# Patient Record
Sex: Female | Born: 1950 | Race: White | Hispanic: No | Smoking: Never smoker
Health system: Southern US, Community
[De-identification: ages and names within clinical notes are randomized; demographics above are authoritative.]

## PROBLEM LIST (undated history)

## (undated) DIAGNOSIS — R42 Dizziness and giddiness: Secondary | ICD-10-CM

## (undated) DIAGNOSIS — M25562 Pain in left knee: Secondary | ICD-10-CM

## (undated) DIAGNOSIS — D229 Melanocytic nevi, unspecified: Secondary | ICD-10-CM

## (undated) HISTORY — PX: WRIST FRACTURE SURGERY: SHX121

## (undated) HISTORY — PX: ANKLE FRACTURE SURGERY: SHX122

## (undated) HISTORY — PX: ABDOMINAL HYSTERECTOMY: SHX81

---
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
## 2000-10-13 ENCOUNTER — Encounter: Payer: Self-pay | Admitting: Family Medicine

## 2000-10-13 ENCOUNTER — Ambulatory Visit (HOSPITAL_COMMUNITY): Admission: RE | Admit: 2000-10-13 | Discharge: 2000-10-13 | Payer: Self-pay | Admitting: Family Medicine

## 2008-02-09 ENCOUNTER — Emergency Department: Payer: Self-pay | Admitting: Emergency Medicine

## 2008-02-09 ENCOUNTER — Ambulatory Visit: Payer: Self-pay | Admitting: General Practice

## 2008-04-26 ENCOUNTER — Encounter: Payer: Self-pay | Admitting: Unknown Physician Specialty

## 2008-04-28 ENCOUNTER — Encounter: Payer: Self-pay | Admitting: Unknown Physician Specialty

## 2008-05-29 ENCOUNTER — Encounter: Payer: Self-pay | Admitting: Unknown Physician Specialty

## 2009-10-07 ENCOUNTER — Emergency Department: Payer: Self-pay | Admitting: Emergency Medicine

## 2012-02-02 DIAGNOSIS — D229 Melanocytic nevi, unspecified: Secondary | ICD-10-CM

## 2012-02-02 HISTORY — DX: Melanocytic nevi, unspecified: D22.9

## 2015-03-15 ENCOUNTER — Other Ambulatory Visit: Payer: Self-pay | Admitting: Neurology

## 2015-03-15 DIAGNOSIS — R42 Dizziness and giddiness: Secondary | ICD-10-CM

## 2015-04-05 ENCOUNTER — Ambulatory Visit
Admission: RE | Admit: 2015-04-05 | Discharge: 2015-04-05 | Disposition: A | Discharge status: Home / Self Care | Payer: BLUE CROSS/BLUE SHIELD | Source: Ambulatory Visit | Attending: Neurology | Admitting: Neurology

## 2015-04-05 ENCOUNTER — Other Ambulatory Visit: Payer: Self-pay | Admitting: Neurology

## 2015-04-05 DIAGNOSIS — R42 Dizziness and giddiness: Secondary | ICD-10-CM | POA: Diagnosis present

## 2015-04-05 DIAGNOSIS — T1590XS Foreign body on external eye, part unspecified, unspecified eye, sequela: Secondary | ICD-10-CM

## 2015-04-05 DIAGNOSIS — Z9889 Other specified postprocedural states: Secondary | ICD-10-CM | POA: Diagnosis not present

## 2015-04-05 MED ORDER — GADOBENATE DIMEGLUMINE 529 MG/ML IV SOLN
20.0000 mL | Freq: Once | INTRAVENOUS | Status: AC | PRN
  Administered 2015-04-05: 20 mL via INTRAVENOUS

## 2015-07-14 ENCOUNTER — Emergency Department (HOSPITAL_COMMUNITY)
Admission: EM | Admit: 2015-07-14 | Discharge: 2015-07-14 | Disposition: A | Discharge status: Home / Self Care | Payer: BLUE CROSS/BLUE SHIELD | Attending: Emergency Medicine | Admitting: Emergency Medicine

## 2015-07-14 ENCOUNTER — Emergency Department (HOSPITAL_COMMUNITY): Payer: BLUE CROSS/BLUE SHIELD

## 2015-07-14 ENCOUNTER — Encounter (HOSPITAL_COMMUNITY): Payer: Self-pay | Admitting: Emergency Medicine

## 2015-07-14 DIAGNOSIS — K8020 Calculus of gallbladder without cholecystitis without obstruction: Secondary | ICD-10-CM

## 2015-07-14 DIAGNOSIS — R1013 Epigastric pain: Secondary | ICD-10-CM | POA: Diagnosis present

## 2015-07-14 DIAGNOSIS — Z7982 Long term (current) use of aspirin: Secondary | ICD-10-CM | POA: Insufficient documentation

## 2015-07-14 HISTORY — DX: Dizziness and giddiness: R42

## 2015-07-14 HISTORY — DX: Pain in left knee: M25.562

## 2015-07-14 LAB — CBC WITH DIFFERENTIAL/PLATELET
Basophils Absolute: 0 K/uL (ref 0.0–0.1)
Basophils Relative: 0 %
Eosinophils Absolute: 0.1 K/uL (ref 0.0–0.7)
Eosinophils Relative: 1 %
HCT: 38.5 % (ref 36.0–46.0)
Hemoglobin: 12.6 g/dL (ref 12.0–15.0)
Lymphocytes Relative: 20 %
Lymphs Abs: 2 K/uL (ref 0.7–4.0)
MCH: 31.2 pg (ref 26.0–34.0)
MCHC: 32.7 g/dL (ref 30.0–36.0)
MCV: 95.3 fL (ref 78.0–100.0)
Monocytes Absolute: 0.4 K/uL (ref 0.1–1.0)
Monocytes Relative: 5 %
Neutro Abs: 7.2 K/uL (ref 1.7–7.7)
Neutrophils Relative %: 74 %
Platelets: 235 K/uL (ref 150–400)
RBC: 4.04 MIL/uL (ref 3.87–5.11)
RDW: 13.7 % (ref 11.5–15.5)
WBC: 9.8 K/uL (ref 4.0–10.5)

## 2015-07-14 LAB — LIPASE, BLOOD: Lipase: 29 U/L (ref 11–51)

## 2015-07-14 LAB — COMPREHENSIVE METABOLIC PANEL WITH GFR
ALT: 28 U/L (ref 14–54)
AST: 50 U/L — ABNORMAL HIGH (ref 15–41)
Albumin: 4.2 g/dL (ref 3.5–5.0)
Alkaline Phosphatase: 99 U/L (ref 38–126)
Anion gap: 6 (ref 5–15)
BUN: 26 mg/dL — ABNORMAL HIGH (ref 6–20)
CO2: 30 mmol/L (ref 22–32)
Calcium: 9.1 mg/dL (ref 8.9–10.3)
Chloride: 108 mmol/L (ref 101–111)
Creatinine, Ser: 0.77 mg/dL (ref 0.44–1.00)
GFR calc Af Amer: >60 mL/min
GFR calc non Af Amer: >60 mL/min
Glucose, Bld: 120 mg/dL — ABNORMAL HIGH (ref 65–99)
Potassium: 3.7 mmol/L (ref 3.5–5.1)
Sodium: 144 mmol/L (ref 135–145)
Total Bilirubin: 0.6 mg/dL (ref 0.3–1.2)
Total Protein: 7.1 g/dL (ref 6.5–8.1)

## 2015-07-14 LAB — URINALYSIS, ROUTINE W REFLEX MICROSCOPIC
BILIRUBIN URINE: NEGATIVE
Glucose, UA: NEGATIVE mg/dL
HGB URINE DIPSTICK: NEGATIVE
Ketones, ur: NEGATIVE mg/dL
LEUKOCYTES UA: NEGATIVE
NITRITE: NEGATIVE
PROTEIN: NEGATIVE mg/dL
SPECIFIC GRAVITY, URINE: 1.025 (ref 1.005–1.030)
pH: 5.5 (ref 5.0–8.0)

## 2015-07-14 LAB — TROPONIN I

## 2015-07-14 MED ORDER — FAMOTIDINE IN NACL 20-0.9 MG/50ML-% IV SOLN
20.0000 mg | Freq: Once | INTRAVENOUS | Status: AC
  Administered 2015-07-14: 20 mg via INTRAVENOUS
  Filled 2015-07-14: qty 50

## 2015-07-14 MED ORDER — IOHEXOL 300 MG/ML  SOLN
50.0000 mL | Freq: Once | INTRAMUSCULAR | Status: AC | PRN
  Administered 2015-07-14: 50 mL via ORAL

## 2015-07-14 MED ORDER — SODIUM CHLORIDE 0.9 % IV SOLN
INTRAVENOUS | Status: DC
  Administered 2015-07-14: 16:00:00 via INTRAVENOUS

## 2015-07-14 MED ORDER — IOHEXOL 300 MG/ML  SOLN
100.0000 mL | Freq: Once | INTRAMUSCULAR | Status: AC | PRN
  Administered 2015-07-14: 100 mL via INTRAVENOUS

## 2015-07-14 NOTE — ED Notes (Signed)
Patient with no complaints at this time. Respirations even and unlabored. Skin warm/dry. Discharge instructions reviewed with patient at this time. Patient given opportunity to voice concerns/ask questions. IV removed per policy and band-aid applied to site. Patient discharged at this time and left Emergency Department with steady gait.  

## 2015-07-14 NOTE — ED Provider Notes (Signed)
CSN: [id number]     Arrival date & time 07/14/15  1408 History   First MD Initiated Contact with Patient 07/14/15 1440     Chief Complaint  Patient presents with  . Abdominal Pain      HPI Pt was seen at 1455.  Per pt, c/o gradual onset and persistence of constant upper abd "pain" that began approximately 1 hour PTA. Pain is located in her mid-epigastric area, and started shortly after she ate a tuna sandwich for lunch.  Has been associated with generalized weakness.  Describes the abd pain as "aching," with radiation into her back. Denies N/V, no diarrhea, no fevers, no back pain, no rash, no CP/SOB, no black or blood in stools.       Past Medical History  Diagnosis Date  . Dizziness   . Left knee pain    Past Surgical History  Procedure Laterality Date  . Abdominal hysterectomy    . Ankle fracture surgery    . Wrist fracture surgery      Social History  Substance Use Topics  . Smoking status: Never Smoker   . Smokeless tobacco: None  . Alcohol Use: No    Review of Systems ROS: Statement: All systems negative except as marked or noted in the HPI; Constitutional: Negative for fever and chills. +generalized weakness.; ; Eyes: Negative for eye pain, redness and discharge. ; ; ENMT: Negative for ear pain, hoarseness, nasal congestion, sinus pressure and sore throat. ; ; Cardiovascular: Negative for chest pain, palpitations, dyspnea and peripheral edema. ; ; Respiratory: Negative for cough, wheezing and stridor. ; ; Gastrointestinal: +abd pain. Negative for nausea, vomiting, diarrhea, blood in stool, hematemesis, jaundice and rectal bleeding. . ; ; Genitourinary: Negative for dysuria, flank pain and hematuria. ; ; Musculoskeletal: Negative for back pain and neck pain. Negative for swelling and trauma.; ; Skin: Negative for pruritus, rash, abrasions, blisters, bruising and skin lesion.; ; Neuro: Negative for headache, lightheadedness and neck stiffness. Negative for weakness, altered  level of consciousness , altered mental status, extremity weakness, paresthesias, involuntary movement, seizure and syncope.      Allergies  Review of patient's allergies indicates no known allergies.  Home Medications   Prior to Admission medications   Medication Sig Start Date End Date Taking? Authorizing Provider  aspirin EC 81 MG tablet Take 81 mg by mouth daily.    Yes Historical Provider, MD  Cholecalciferol (VITAMIN D3 SUPER STRENGTH) 2000 units TABS Take 1 tablet by mouth daily.    Yes Historical Provider, MD  Ginger, Zingiber officinalis, (GINGER PO) Take 1 tablet by mouth daily.    Yes Historical Provider, MD  meclizine (ANTIVERT) 12.5 MG tablet Take 12.5 mg by mouth daily as needed for dizziness.    Yes Historical Provider, MD  Multiple Vitamins-Minerals (MULTIVITAMIN WITH MINERALS) tablet Take 1 tablet by mouth daily.   Yes Historical Provider, MD  TURMERIC PO Take 1 tablet by mouth daily.   Yes Historical Provider, MD   BP 158/74 mmHg  Pulse 89  Temp(Src) 97.8 F (36.6 C) (Oral)  Resp 18  Ht 5' 6.5" (1.689 m)  Wt 248 lb (112.492 kg)  BMI 39.43 kg/m2  SpO2 99% Physical Exam  1500: Physical examination:  Nursing notes reviewed; Vital signs and O2 SAT reviewed;  Constitutional: Well developed, Well nourished, Well hydrated, In no acute distress; Head:  Normocephalic, atraumatic; Eyes: EOMI, PERRL, No scleral icterus; ENMT: Mouth and pharynx normal, Mucous membranes moist; Neck: Supple, Full range of motion, No  lymphadenopathy; Cardiovascular: Regular rate and rhythm, No murmur, rub, or gallop; Respiratory: Breath sounds clear & equal bilaterally, No rales, rhonchi, wheezes.  Speaking full sentences with ease, Normal respiratory effort/excursion; Chest: Nontender, Movement normal; Abdomen: Soft, +mild mid-epigastric area tender to palp. No rebound or guarding. Nondistended, Normal bowel sounds; Genitourinary: No CVA tenderness; Spine:  No midline CS, TS, LS tenderness.;;  Extremities: Pulses normal, No tenderness, No edema, No calf edema or asymmetry.; Neuro: AA&Ox3, Major CN grossly intact.  Speech clear. No gross focal motor or sensory deficits in extremities. Climbs on and off stretcher easily by herself. Gait steady.; Skin: Color normal, Warm, Dry.   ED Course  Procedures (including critical care time) Labs Review  Imaging Review  I have personally reviewed and evaluated these images and lab results as part of my medical decision-making.   EKG Interpretation   Date/Time:  Saturday July 14 2015 14:33:52 EDT Ventricular Rate:  84 PR Interval:  148 QRS Duration: 90 QT Interval:  396 QTC Calculation: 467 R Axis:   12 Text Interpretation:  Normal sinus rhythm Normal ECG No old tracing to  compare Confirmed by [facility]  MD, Nunzio Cory [phone]) on 07/14/2015 3:03:22 PM      MDM  MDM Reviewed: previous chart, nursing note and vitals Reviewed previous: labs and ECG Interpretation: labs, ECG, x-ray and CT scan      Results for orders placed or performed during the hospital encounter of 07/14/15  Urinalysis, Routine w reflex microscopic (not at [facility])  Result Value Ref Range   Color, Urine YELLOW YELLOW   APPearance HAZY (A) CLEAR   Specific Gravity, Urine 1.025 1.005 - 1.030   pH 5.5 5.0 - 8.0   Glucose, UA NEGATIVE NEGATIVE mg/dL   Hgb urine dipstick NEGATIVE NEGATIVE   Bilirubin Urine NEGATIVE NEGATIVE   Ketones, ur NEGATIVE NEGATIVE mg/dL   Protein, ur NEGATIVE NEGATIVE mg/dL   Nitrite NEGATIVE NEGATIVE   Leukocytes, UA NEGATIVE NEGATIVE  CBC with Differential  Result Value Ref Range   WBC 9.8 4.0 - 10.5 K/uL   RBC 4.04 3.87 - 5.11 MIL/uL   Hemoglobin 12.6 12.0 - 15.0 g/dL   HCT 38.5 36.0 - 46.0 %   MCV 95.3 78.0 - 100.0 fL   MCH 31.2 26.0 - 34.0 pg   MCHC 32.7 30.0 - 36.0 g/dL   RDW 13.7 11.5 - 15.5 %   Platelets 235 150 - 400 K/uL   Neutrophils Relative % 74 %   Neutro Abs 7.2 1.7 - 7.7 K/uL   Lymphocytes Relative 20 %   Lymphs  Abs 2.0 0.7 - 4.0 K/uL   Monocytes Relative 5 %   Monocytes Absolute 0.4 0.1 - 1.0 K/uL   Eosinophils Relative 1 %   Eosinophils Absolute 0.1 0.0 - 0.7 K/uL   Basophils Relative 0 %   Basophils Absolute 0.0 0.0 - 0.1 K/uL  Comprehensive metabolic panel  Result Value Ref Range   Sodium 144 135 - 145 mmol/L   Potassium 3.7 3.5 - 5.1 mmol/L   Chloride 108 101 - 111 mmol/L   CO2 30 22 - 32 mmol/L   Glucose, Bld 120 (H) 65 - 99 mg/dL   BUN 26 (H) 6 - 20 mg/dL   Creatinine, Ser 0.77 0.44 - 1.00 mg/dL   Calcium 9.1 8.9 - 10.3 mg/dL   Total Protein 7.1 6.5 - 8.1 g/dL   Albumin 4.2 3.5 - 5.0 g/dL   AST 50 (H) 15 - 41 U/L   ALT 28  14 - 54 U/L   Alkaline Phosphatase 99 38 - 126 U/L   Total Bilirubin 0.6 0.3 - 1.2 mg/dL   GFR calc non Af Amer >60 >60 mL/min   GFR calc Af Amer >60 >60 mL/min   Anion gap 6 5 - 15  Lipase, blood  Result Value Ref Range   Lipase 29 11 - 51 U/L  Troponin I  Result Value Ref Range   Troponin I <0.03 <0.031 ng/mL   Dg Chest 2 View 07/14/2015  CLINICAL DATA:  Epigastric pain today, initial encounter EXAM: CHEST  2 VIEW COMPARISON:  None. FINDINGS: The heart size and mediastinal contours are within normal limits. Both lungs are clear. The visualized skeletal structures are unremarkable. IMPRESSION: No active cardiopulmonary disease. Electronically Signed   By: Inez Catalina M.D.   On: 07/14/2015 17:02   Ct Abdomen Pelvis W Contrast 07/14/2015  CLINICAL DATA:  Epigastric pain EXAM: CT ABDOMEN AND PELVIS WITH CONTRAST TECHNIQUE: Multidetector CT imaging of the abdomen and pelvis was performed using the standard protocol following bolus administration of intravenous contrast. CONTRAST:  78mL OMNIPAQUE IOHEXOL 300 MG/ML SOLN, 12mL OMNIPAQUE IOHEXOL 300 MG/ML SOLN COMPARISON:  None. FINDINGS: Lung bases are free of acute infiltrate or sizable effusion. The liver, spleen, adrenal glands and pancreas are within normal limits. The gallbladder is well distended and demonstrates  a single dependent gallstone. No complicating factors are seen. The kidneys are well visualized bilaterally and demonstrate no renal calculi or obstructive changes. The ureters are within normal limits. Bladder is partially distended. A tiny calcification is noted inferiorly within the bladder likely related to a recently passed stone. The appendix is is not well visualized although no inflammatory changes are seen. No pelvic mass lesion is seen. The osseous structures show no acute abnormality. Degenerative change of the lumbar spine is seen. IMPRESSION: Calcification within the inferior aspect of the bladder which may represent a recently passed stone. Cholelithiasis without complicating factors. No other focal abnormality is seen Electronically Signed   By: Inez Catalina M.D.   On: 07/14/2015 17:29    1745:  Non-specific elevation of AST. Workup otherwise reassuring. Doubt ACS as cause for atypical symptoms without chest pain complaint and Heart score 1 (age); doubt PE with low risk Wells. Pt has tol PO well while in the ED without N/V.  No stooling while in the ED.  Abd benign, VSS. Feels better and wants to go home now. Tx symptomatically, f/u GI MD. Dx and testing d/w pt.  Questions answered.  Verb understanding, agreeable to d/c home with outpt f/u.     Francine Graven, DO 07/18/15 1758

## 2015-07-14 NOTE — ED Notes (Signed)
PT c/o epigastric pain intermittently after eating lunch today at work and became diaphoretic and had generalized weakness. PT states has had pain return a few times but not as severe and states pain radiates to her back. PT denies any urinary symptoms, n/v/d.

## 2015-07-14 NOTE — Discharge Instructions (Signed)
Eat a bland diet, avoiding greasy, fatty, fried foods, as well as spicy and acidic foods or beverages.  Avoid eating within the hour or 2 before going to bed or laying down.  Also avoid teas, colas, coffee, chocolate, pepermint and spearment.  Take over the counter pepcid, one tablet by mouth twice a day, for the next 2 to 3 weeks.  May also take over the counter maalox/mylanta, as directed on packaging, as needed for discomfort. One of your liver function tests (AST) was mildly elevated today. Avoid tylenol, tylenol containing products, as well as alcohol until you are seen in follow up. Call your regular medical doctor and the GI doctor on Monday to schedule a follow up appointment this week and to re-check your liver function tests.  Return to the Emergency Department immediately if worsening.

## 2017-03-01 IMAGING — CT CT ABD-PELV W/ CM
2 of 5 series · 17 of 46 positions shown, 19 images · IV contrast (Omnipaque 300)
Comparison: None.

CLINICAL DATA: Epigastric pain

EXAM:
CT ABDOMEN AND PELVIS WITH CONTRAST
TECHNIQUE: Multidetector CT imaging of the abdomen and pelvis was performed
using the standard protocol following bolus administration of
intravenous contrast.
CONTRAST:  50mL OMNIPAQUE IOHEXOL 300 MG/ML SOLN, 100mL OMNIPAQUE
IOHEXOL 300 MG/ML SOLN

[Series 2: abd_pel_with 5.0 b40f · axial · 0.86mm/px · z∈[-312,+64]mm · 14 of 86 slices shown, 16 images]
[im 6/86  soft-tissue]
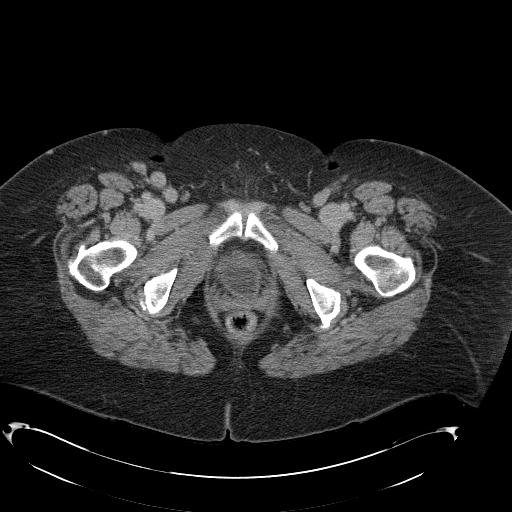
[im 6/86  bone]
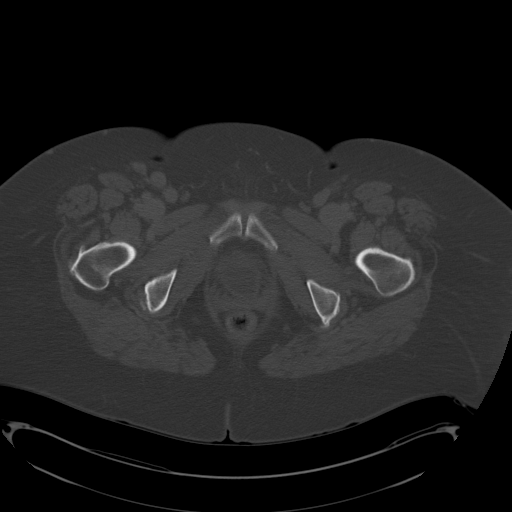
[im 11/86  soft-tissue]
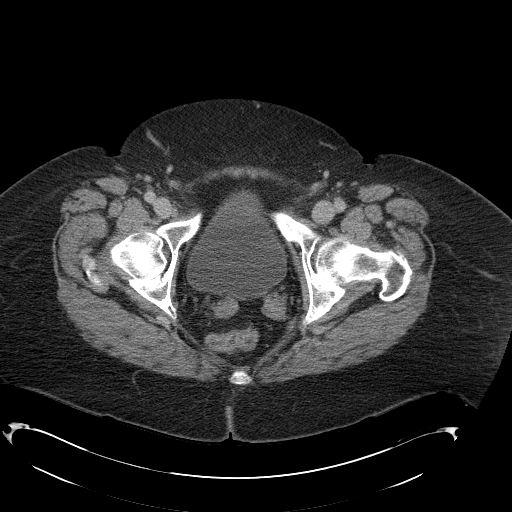
[im 16/86  soft-tissue]
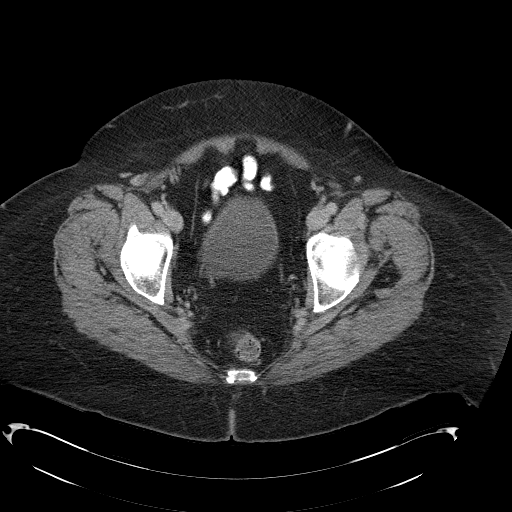
[im 26/86  soft-tissue]
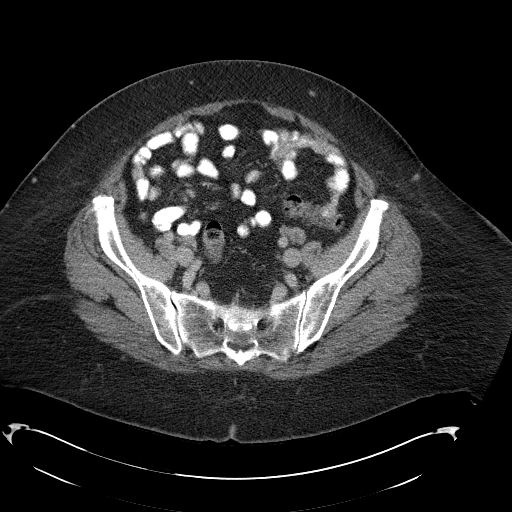
[im 31/86  soft-tissue]
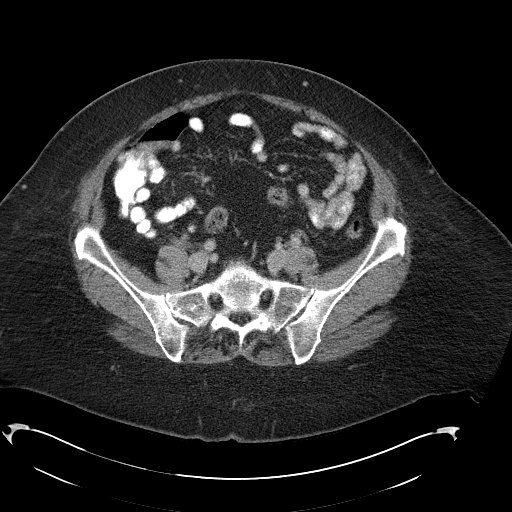
[im 36/86  soft-tissue]
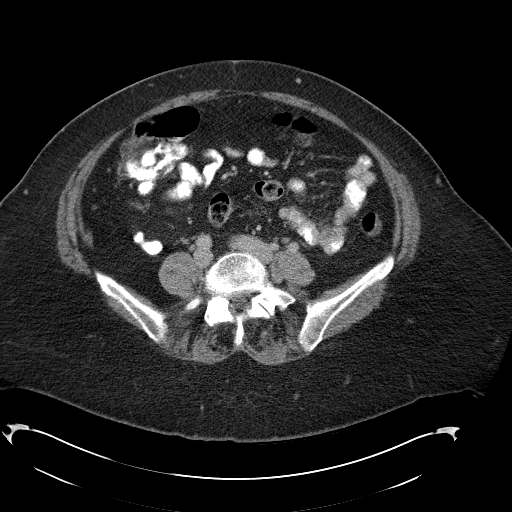
[im 41/86  soft-tissue]
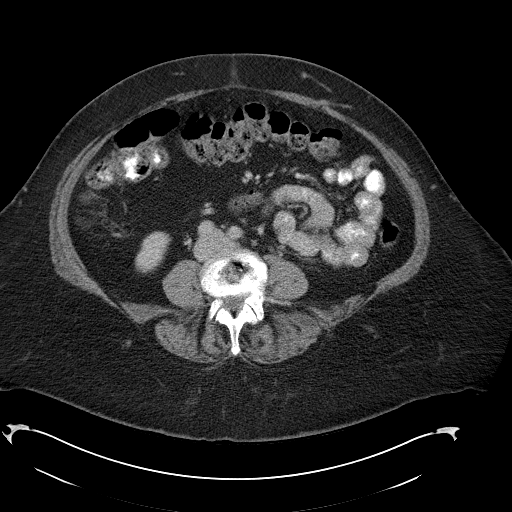
[im 46/86  soft-tissue]
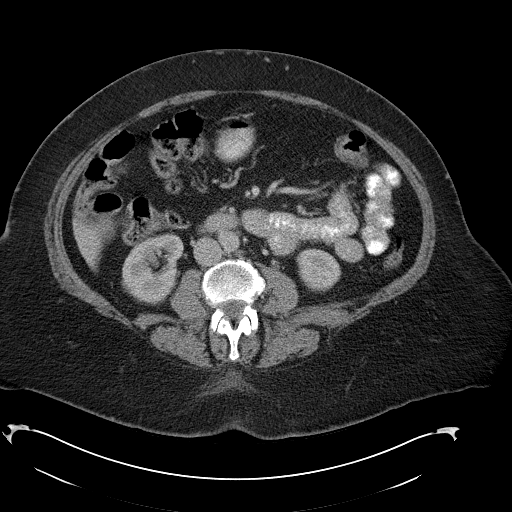
[im 51/86  soft-tissue]
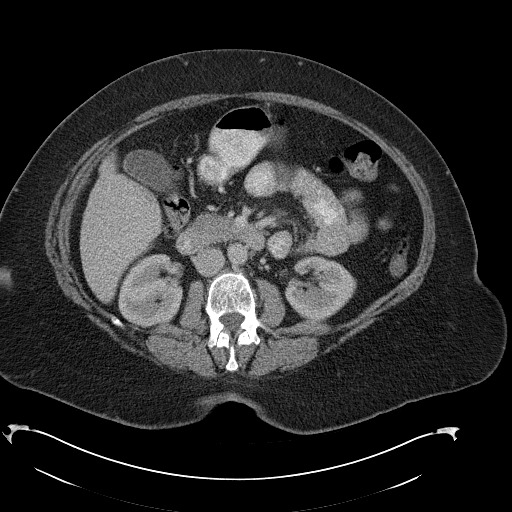
[im 51/86  bone]
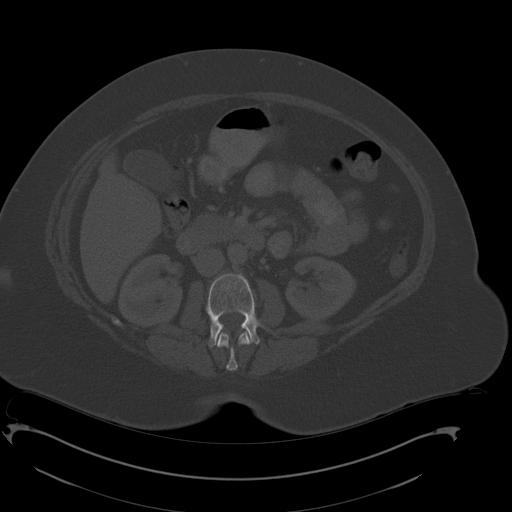
[im 56/86  soft-tissue]
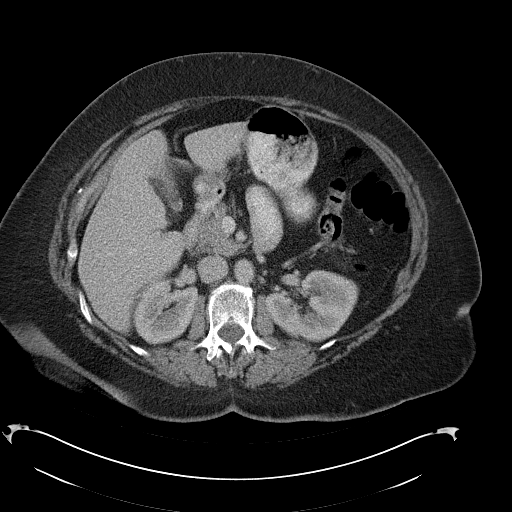
[im 66/86  soft-tissue]
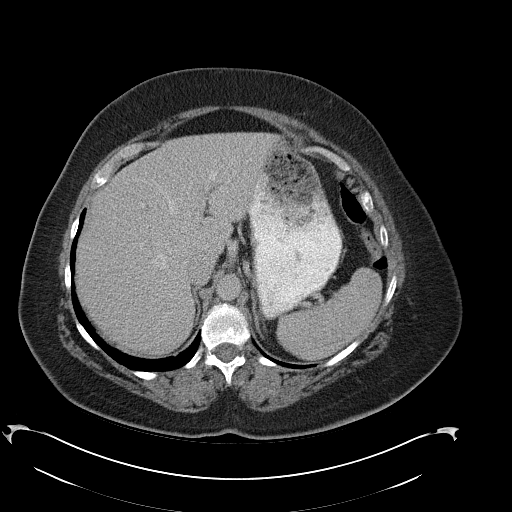
[im 71/86  soft-tissue]
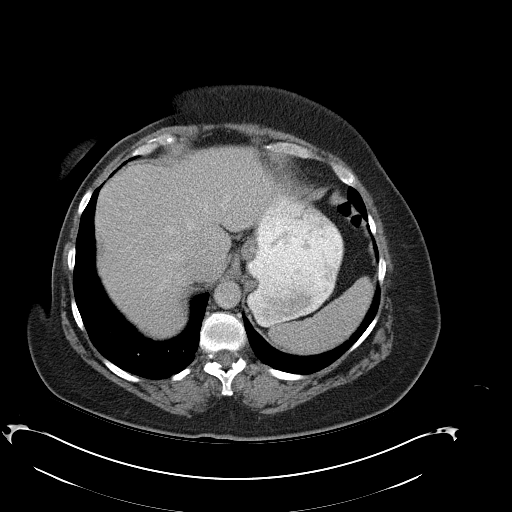
[im 76/86  soft-tissue]
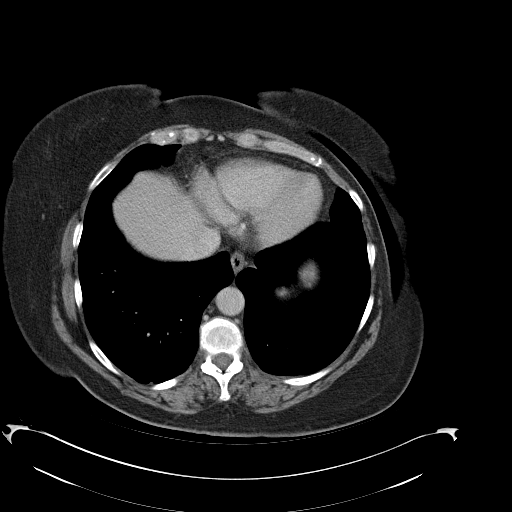
[im 81/86  soft-tissue]
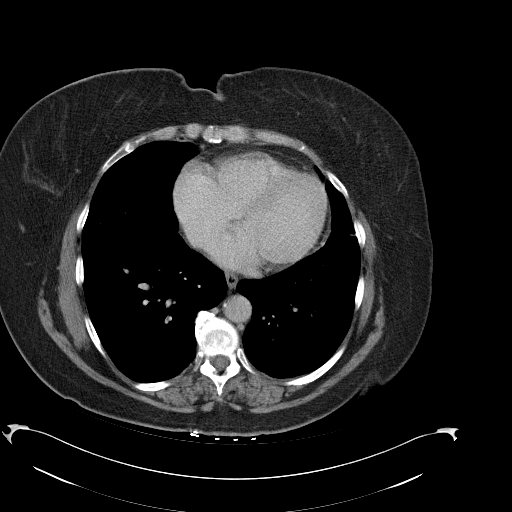

[Series 3: abd_pel_with 3.0 spo cor · coronal · 0.76mm/px · 3 of 100 slices shown]
[im 34/100  soft-tissue]
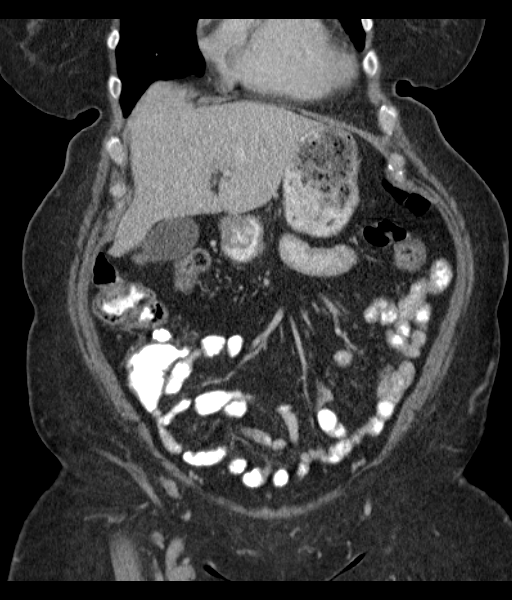
[im 45/100  soft-tissue]
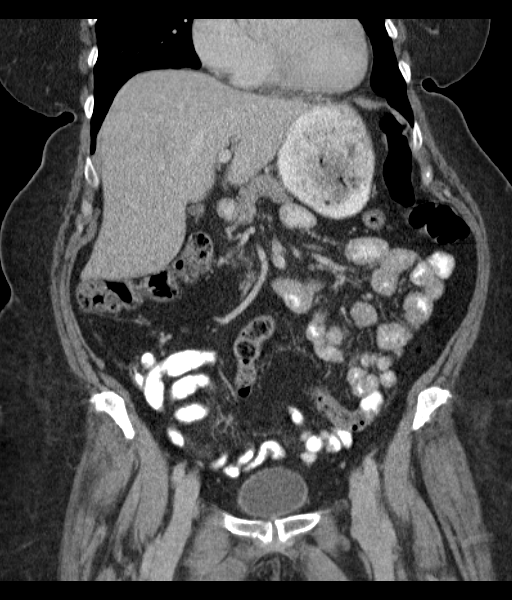
[im 56/100  soft-tissue]
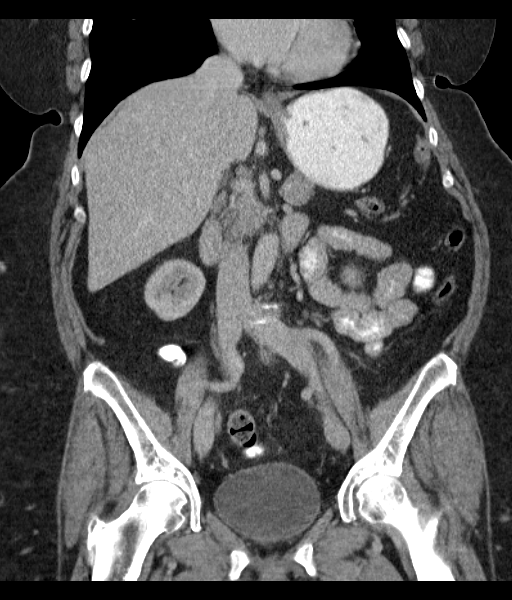

[17 of 46 positions shown; findings below may reference images not displayed]

FINDINGS: Lung bases are free of acute infiltrate or sizable effusion.

The liver, spleen, adrenal glands and pancreas are within normal
limits. The gallbladder is well distended and demonstrates a single
dependent gallstone. No complicating factors are seen.

The kidneys are well visualized bilaterally and demonstrate no renal
calculi or obstructive changes. The ureters are within normal
limits. Bladder is partially distended. A tiny calcification is
noted inferiorly within the bladder likely related to a recently
passed stone.

The appendix is is not well visualized although no inflammatory
changes are seen. No pelvic mass lesion is seen. The osseous
structures show no acute abnormality. Degenerative change of the
lumbar spine is seen.
IMPRESSION: Calcification within the inferior aspect of the bladder which may
represent a recently passed stone.

Cholelithiasis without complicating factors.

No other focal abnormality is seen

## 2017-03-01 IMAGING — DX DG CHEST 2V
2 series · 2 of 2 positions shown · non-contrast
Comparison: None.

CLINICAL DATA: Epigastric pain today, initial encounter

EXAM:
CHEST  2 VIEW

[chest pa]
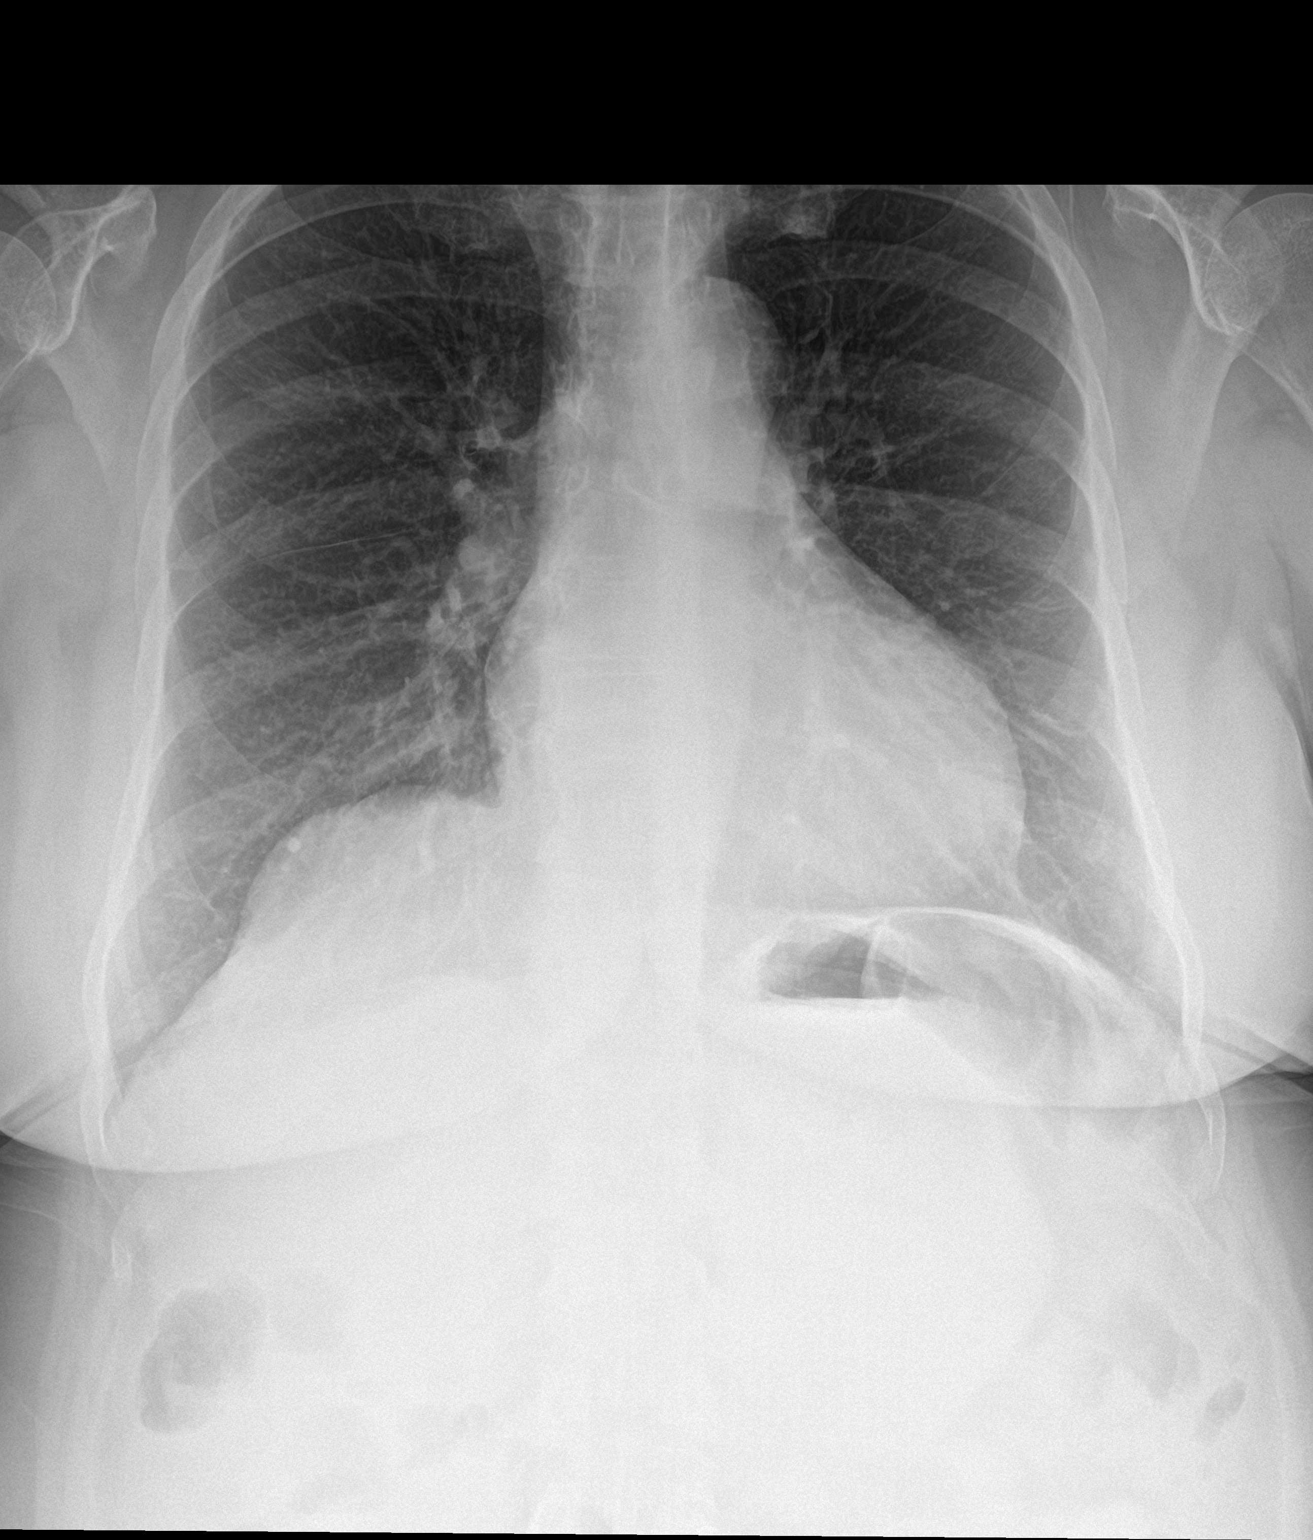

[chest lat]
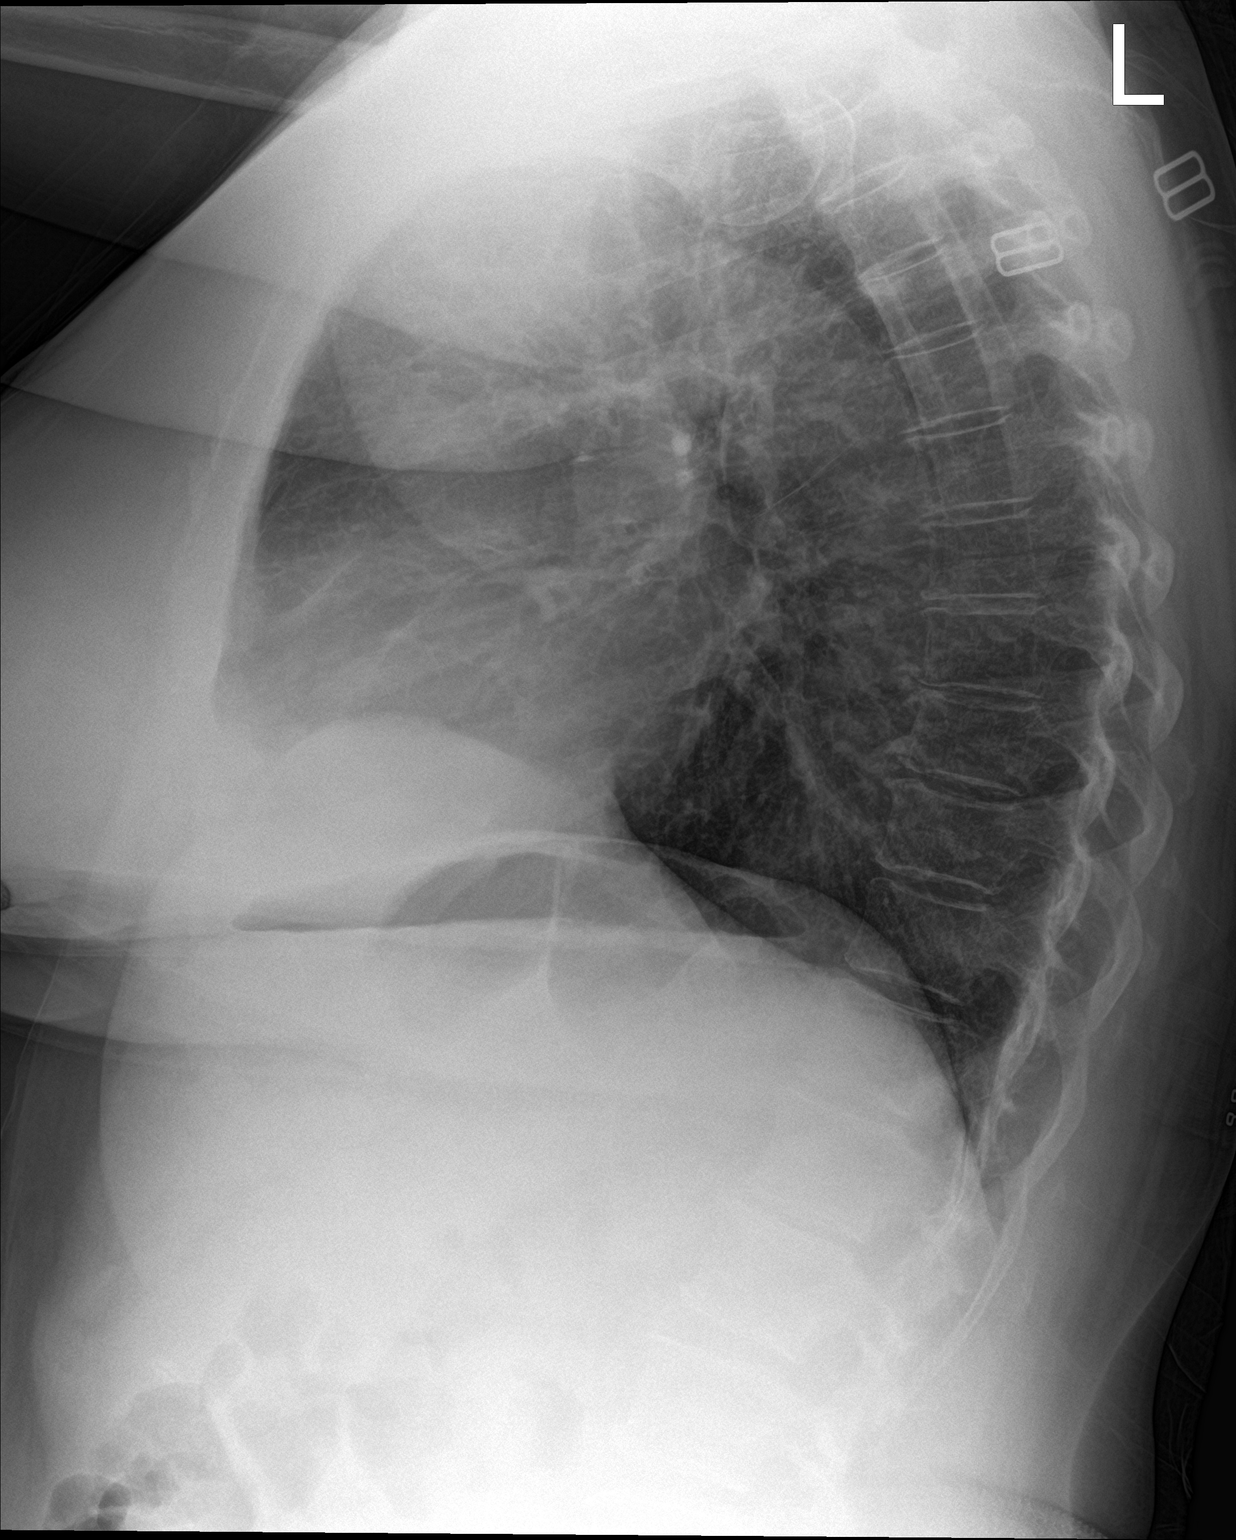

[2 of 2 positions shown; findings below may reference images not displayed]

FINDINGS: The heart size and mediastinal contours are within normal limits.
Both lungs are clear. The visualized skeletal structures are
unremarkable.
IMPRESSION: No active cardiopulmonary disease.

## 2018-10-14 NOTE — Progress Notes (Signed)
CARDIOLOGY CONSULT NOTE       Patient ID: AMARRA SAWYER MRN: [id number] DOB/AGE: April 20, 1951 68 y.o.  Admit date: (Not on file) Referring Physician: Clemmie Krill Primary Physician: [facility] Primary Cardiologist: None  Reason for Consultation: Cardiomegaly   Active Problems:   * No active hospital problems. *   HPI:  68 y.o. referred by primary for cardiomegaly. Reviewed echo report done 10/11/18 EF normal 65%, normal RV moderate LAE 4.4 cm no other significant valvular disease Office note from primary [place] 10/03/18 Noted falling in driveway earlier with contusion to chest ? Vertigo and dizziness She was prescribed meclizine BP and HR were fine CXR with no acute findings or rib fractures Rx Aleve bid with food For left sided rib pain Suspect echo was ordered due to CE seen on AP CXR   She has no cardiac symptoms She has no HTN, dyspnea or arrhythmia She has had vertigo And had vestibular PT in [place] in past.   She is retired from [employer] in [place] Has older daughter and son living with her as well as grand children Widowed   Lab review with Cr 0.62 normal LFTls Hct 39.2 LDL 111  ROS All other systems reviewed and negative except as noted above  Past Medical History:  Diagnosis Date  . Dizziness   . Left knee pain     Family History  Problem Relation Age of Onset  . Hypertension Brother     Social History   Socioeconomic History  . Marital status: Divorced    Spouse name: Not on file  . Number of children: Not on file  . Years of education: Not on file  . Highest education level: Not on file  Occupational History  . Not on file  Social Needs  . Financial resource strain: Not on file  . Food insecurity    Worry: Not on file    Inability: Not on file  . Transportation needs    Medical: Not on file    Non-medical: Not on file  Tobacco Use  . Smoking status: Never Smoker  . Smokeless tobacco: Never  Used  Substance and Sexual Activity  . Alcohol use: No  . Drug use: No  . Sexual activity: Not on file  Lifestyle  . Physical activity    Days per week: Not on file    Minutes per session: Not on file  . Stress: Not on file  Relationships  . Social Herbalist on phone: Not on file    Gets together: Not on file    Attends religious service: Not on file    Active member of club or organization: Not on file    Attends meetings of clubs or organizations: Not on file    Relationship status: Not on file  . Intimate partner violence    Fear of current or ex partner: Not on file    Emotionally abused: Not on file    Physically abused: Not on file    Forced sexual activity: Not on file  Other Topics Concern  . Not on file  Social History Narrative  . Not on file    Past Surgical History:  Procedure Laterality Date  . ABDOMINAL HYSTERECTOMY    . ANKLE FRACTURE SURGERY    . WRIST FRACTURE SURGERY          Physical Exam: Blood pressure (!) 156/82, pulse 86, temperature 99 F (37.2 C), height 5\' 7"  (1.702  m), weight 105.7 kg, SpO2 95 %.    Affect appropriate Healthy:  appears stated age 68: normal Neck supple with no adenopathy JVP normal no bruits no thyromegaly Lungs clear with no wheezing and good diaphragmatic motion Heart:  S1/S2 no murmur, no rub, gallop or click PMI normal Abdomen: benighn, BS positve, no tenderness, no AAA no bruit.  No HSM or HJR Distal pulses intact with no bruits No edema Neuro non-focal Skin warm and dry No muscular weakness   Labs:   Lab Results  Component Value Date   WBC 9.8 07/14/2015   HGB 12.6 07/14/2015   HCT 38.5 07/14/2015   MCV 95.3 07/14/2015   PLT 235 07/14/2015   No results for input(s): NA, K, CL, CO2, BUN, CREATININE, CALCIUM, PROT, BILITOT, ALKPHOS, ALT, AST, GLUCOSE in the last 168 hours.  Invalid input(s): LABALBU Lab Results  Component Value Date   TROPONINI <0.03 07/14/2015   No results found  for: CHOL No results found for: HDL No results found for: LDLCALC No results found for: TRIG No results found for: CHOLHDL No results found for: LDLDIRECT    Radiology: No results found.  EKG: SR rate 84 normal 2017    ASSESSMENT AND PLAN:   LAE:  Not clinically significant no signs of PAF no signs of HTN or diastolic dysfunction Explained to her difference between LAE and not having cardiomegaly from LVE Her LV size and function is normal  Vertigo:  F/u primary meclizine PRN consider vestibular rehab   F/U PRN    Signed: Jenkins Rouge 10/15/2018, 3:11 PM

## 2018-10-15 ENCOUNTER — Ambulatory Visit: Payer: Medicare Other | Admitting: Cardiovascular Disease

## 2018-10-15 ENCOUNTER — Encounter: Payer: Self-pay | Admitting: Cardiovascular Disease

## 2018-10-15 ENCOUNTER — Other Ambulatory Visit: Payer: Self-pay

## 2018-10-15 VITALS — BP 156/82 | HR 86 | Temp 99.0°F | Ht 67.0 in | Wt 233.0 lb

## 2018-10-15 DIAGNOSIS — I10 Essential (primary) hypertension: Secondary | ICD-10-CM | POA: Diagnosis not present

## 2018-10-15 NOTE — Patient Instructions (Signed)
Medication Instructions: Your physician recommends that you continue on your current medications as directed. Please refer to the Current Medication list given to you today.   Labwork: none  Procedures/Testing: none  Follow-Up: As needed  Any Additional Special Instructions Will Be Listed Below (If Applicable).     If you need a refill on your cardiac medications before your next appointment, please call your pharmacy.      Thank you for choosing [facility] !

## 2019-02-02 ENCOUNTER — Encounter: Payer: Self-pay | Admitting: *Deleted

## 2019-11-16 ENCOUNTER — Ambulatory Visit: Payer: Medicare Other | Admitting: Dermatology

## 2019-11-16 ENCOUNTER — Other Ambulatory Visit: Payer: Self-pay

## 2019-11-16 DIAGNOSIS — D1801 Hemangioma of skin and subcutaneous tissue: Secondary | ICD-10-CM

## 2019-11-16 DIAGNOSIS — I83813 Varicose veins of bilateral lower extremities with pain: Secondary | ICD-10-CM

## 2019-11-16 DIAGNOSIS — L918 Other hypertrophic disorders of the skin: Secondary | ICD-10-CM

## 2019-11-16 DIAGNOSIS — D229 Melanocytic nevi, unspecified: Secondary | ICD-10-CM

## 2019-11-16 DIAGNOSIS — D225 Melanocytic nevi of trunk: Secondary | ICD-10-CM | POA: Diagnosis not present

## 2019-11-16 NOTE — Progress Notes (Addendum)
     Follow-Up Visit   Subjective  Autumn Bowman is a 69 y.o. female who presents for the following: Annual Exam (Concerns some skin tag like spot on left neck and beside the left eye. Check right neck and lef tleg dark spot patient is worried about. ).  Dark spot Location: Leg Duration:  Quality:  Associated Signs/Symptoms: Modifying Factors:  Severity:  Timing: Context:   Objective  Well appearing patient in no apparent distress; mood and affect are within normal limits.  All sun exposed areas plus back examined. plus legs.   Assessment & Plan    Skin tag (2) Left Temple; Left Anterior Neck  Scissor excision at patient's request  Hemangioma of skin Left Lower Leg - Anterior  Leave if stable  Varicose veins of both lower extremities with pain (2) Left Popliteal Fossa; Right Lower Leg - Posterior  Discuss monitoring treatment options and given Dr. Elta Guadeloupe Featherstone's contact number if she wishes to pursue this.  Nevus Mid Back  Annual skin examination      I, Autumn Monarch, MD, have reviewed all documentation for this visit.  The documentation on 12/05/19 for the exam, diagnosis, procedures, and orders are all accurate and complete.   Subjective  Autumn Bowman is a 69 y.o. female who presents for the following: Annual Exam (Concerns some skin tag like spot on left neck and beside the left eye. Check right neck and lef tleg dark spot patient is worried about. ).  Growth Location: Leg Duration:  Quality:  Associated Signs/Symptoms: Modifying Factors:  Severity:  Timing: Context: Also wants all moles checked.  Follow-up visit for Autumn Bowman date of birth March 13, 1951.  Several dermatological issues discussed today.  Her chief concern was darkened bump on the left upper inner shin; this 5 mm purplish smooth papule with dermoscopy showed atypical benign angioma and does not require removal at present.  Likewise a textured tan flat-topped  papule on the right lower front neck is a benign keratosis.  She requested removal of 2 irritated skin tags on the left outer orbit and left neck; this was accomplished without problem.  We discussed treatment options for her facial  "age spots"; her spots are really extremely subtle and I discouraged treatment.  I updated the concepts of varicose vein risks and treatment.  If she wants to consult Dr. Cresenciano Lick, his number was provided.  I checked all the moles on her sun exposed areas, legs, upper chest, and back: There are no atypical lesions.  Follow-up can be on an as-needed basis. The following portions of the chart were reviewed this encounter and updated as appropriate: Tobacco  Allergies  Meds  Problems  Med Hx  Surg Hx  Fam Hx      Objective  Well appearing patient in no apparent distress; mood and affect are within normal limits.  All sun exposed areas plus back examined.  Plus chest, and legs.   Assessment & Plan  Skin tag (2) Left Temple; Left Anterior Neck  Scissor excision at patient's request  Hemangioma of skin Left Lower Leg - Anterior  Leave if stable  Varicose veins of both lower extremities with pain (2) Left Popliteal Fossa; Right Lower Leg - Posterior  Discuss monitoring treatment options and given Dr. Elta Guadeloupe Featherstone's contact number if she wishes to pursue this.  Nevus Mid Back  Annual skin examination

## 2019-11-27 ENCOUNTER — Encounter: Payer: Self-pay | Admitting: Dermatology

## 2020-11-19 ENCOUNTER — Ambulatory Visit: Payer: Medicare Other | Admitting: Dermatology

## 2021-02-05 ENCOUNTER — Other Ambulatory Visit: Payer: Self-pay

## 2021-02-05 ENCOUNTER — Ambulatory Visit: Payer: Medicare Other | Admitting: Dermatology

## 2021-02-05 ENCOUNTER — Encounter: Payer: Self-pay | Admitting: Dermatology

## 2021-02-05 DIAGNOSIS — D239 Other benign neoplasm of skin, unspecified: Secondary | ICD-10-CM

## 2021-02-05 DIAGNOSIS — L728 Other follicular cysts of the skin and subcutaneous tissue: Secondary | ICD-10-CM | POA: Diagnosis not present

## 2021-02-05 DIAGNOSIS — D1801 Hemangioma of skin and subcutaneous tissue: Secondary | ICD-10-CM

## 2021-02-05 DIAGNOSIS — D2371 Other benign neoplasm of skin of right lower limb, including hip: Secondary | ICD-10-CM | POA: Diagnosis not present

## 2021-02-05 DIAGNOSIS — Z86018 Personal history of other benign neoplasm: Secondary | ICD-10-CM

## 2021-02-05 DIAGNOSIS — D2272 Melanocytic nevi of left lower limb, including hip: Secondary | ICD-10-CM | POA: Diagnosis not present

## 2021-02-05 DIAGNOSIS — Z1283 Encounter for screening for malignant neoplasm of skin: Secondary | ICD-10-CM

## 2021-02-05 DIAGNOSIS — L821 Other seborrheic keratosis: Secondary | ICD-10-CM

## 2021-02-05 DIAGNOSIS — L729 Follicular cyst of the skin and subcutaneous tissue, unspecified: Secondary | ICD-10-CM

## 2021-02-05 DIAGNOSIS — D485 Neoplasm of uncertain behavior of skin: Secondary | ICD-10-CM

## 2021-02-05 NOTE — Patient Instructions (Signed)

## 2021-02-11 ENCOUNTER — Telehealth: Payer: Self-pay | Admitting: *Deleted

## 2021-02-11 NOTE — Telephone Encounter (Signed)
-----   Message from Lavonna Monarch, MD sent at 02/08/2021  6:16 AM EDT ----- Schedule routine nonsurgical visit for a wider shave.  Although only moderately atypical, the margins are involved and it is an area that the patient cannot easily follow.

## 2021-02-11 NOTE — Telephone Encounter (Signed)
Pathology results to patient- surgery appointment scheduled.  

## 2021-02-20 ENCOUNTER — Encounter: Payer: Self-pay | Admitting: Dermatology

## 2021-02-20 NOTE — Progress Notes (Signed)
   Follow-Up Visit   Subjective  Autumn Bowman is a 70 y.o. female who presents for the following: Annual Exam (No concerns just a yearly full body skin check. Patient has history of mod/sev atypia).  General skin examination, history of atypical moles Location:  Duration:  Quality:  Associated Signs/Symptoms: Modifying Factors:  Severity:  Timing: Context:   Objective  Well appearing patient in no apparent distress; mood and affect are within normal limits. Generalized, Right Lower Leg - Anterior Right lower shin, 5 mm firm pink dermal papule  Left Abdomen (side) - Upper Multiple smooth red 1 mm dermal papules  Neck - Anterior 30mm white noninflamed dermal papule  Mid Back Stuck-on, waxy papules and plaques.   Mid Back Full body skin exam.  No atypical pigmented lesions (new or recurrent).  No nonmelanoma skin cancer.  Left Thigh - Posterior         A full examination was performed including scalp, head, eyes, ears, nose, lips, neck, chest, axillae, abdomen, back, buttocks, bilateral upper extremities, bilateral lower extremities, hands, feet, fingers, toes, fingernails, and toenails. All findings within normal limits unless otherwise noted below.  Areas beneath undergarments not fully examined.   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  Courage to self examine twice annually.  Continue ultraviolet protection.  Neoplasm of uncertain behavior of skin Left Thigh - Posterior  Skin / nail biopsy Type of biopsy: tangential   Informed consent: discussed and consent obtained   Timeout: patient name, date of birth, surgical site,  and procedure verified   Anesthesia: the lesion was anesthetized in a standard fashion   Anesthetic:  1% lidocaine w/ epinephrine 1-100,000 local infiltration Instrument used: flexible razor blade   Hemostasis achieved with: ferric subsulfate   Outcome: patient tolerated procedure well   Post-procedure details: wound care instructions given    Specimen 1 - Surgical pathology Differential Diagnosis: atypia  Check Margins: yes      I, Lavonna Monarch, MD, have reviewed all documentation for this visit.  The documentation on 02/20/21 for the exam, diagnosis, procedures, and orders are all accurate and complete.

## 2021-04-11 ENCOUNTER — Other Ambulatory Visit: Payer: Self-pay

## 2021-04-11 ENCOUNTER — Encounter: Payer: Self-pay | Admitting: Dermatology

## 2021-04-11 ENCOUNTER — Ambulatory Visit: Payer: Medicare Other | Admitting: Dermatology

## 2021-04-11 DIAGNOSIS — D239 Other benign neoplasm of skin, unspecified: Secondary | ICD-10-CM

## 2021-04-11 DIAGNOSIS — D2372 Other benign neoplasm of skin of left lower limb, including hip: Secondary | ICD-10-CM | POA: Diagnosis not present

## 2021-04-11 NOTE — Patient Instructions (Signed)

## 2021-05-07 ENCOUNTER — Encounter: Payer: Self-pay | Admitting: Dermatology

## 2021-05-07 NOTE — Progress Notes (Signed)
° °  Follow-Up Visit   Subjective  Autumn Bowman is a 71 y.o. female who presents for the following: Procedure (Moderate w/s left thigh ).  Dysplastic nevus left posterior thigh, for wider excision. Location:  Duration:  Quality:  Associated Signs/Symptoms: Modifying Factors:  Severity:  Timing: Context:   Objective  Well appearing patient in no apparent distress; mood and affect are within normal limits. Left Thigh - Posterior Biopsy site identified by nurse and me    A focused examination was performed including legs. Relevant physical exam findings are noted in the Assessment and Plan.   Assessment & Plan    Dysplastic nevus Left Thigh - Posterior  1.5 mm margin marked, wider deeper shave excision with light cautery of base  Epidermal / dermal shaving - Left Thigh - Posterior  Lesion diameter (cm):  1.6 Informed consent: discussed and consent obtained   Timeout: patient name, date of birth, surgical site, and procedure verified   Procedure prep:  Patient was prepped and draped in usual sterile fashion Prep type:  Chlorhexidine Anesthesia: the lesion was anesthetized in a standard fashion   Anesthetic:  1% lidocaine w/ epinephrine 1-100,000 local infiltration Instrument used: DermaBlade   Hemostasis achieved with: aluminum chloride   Outcome: patient tolerated procedure well   Post-procedure details: sterile dressing applied and wound care instructions given   Dressing type: petrolatum gauze, petrolatum and bandage    Specimen 1 - Surgical pathology Differential Diagnosis r/o atypia (WS) [id number]  Check Margins: No      I, Lavonna Monarch, MD, have reviewed all documentation for this visit.  The documentation on 05/07/21 for the exam, diagnosis, procedures, and orders are all accurate and complete.

## 2021-07-10 ENCOUNTER — Ambulatory Visit: Payer: Medicare Other | Admitting: Dermatology

## 2021-07-10 ENCOUNTER — Other Ambulatory Visit: Payer: Self-pay

## 2021-07-10 ENCOUNTER — Encounter: Payer: Self-pay | Admitting: Dermatology

## 2021-07-10 DIAGNOSIS — L729 Follicular cyst of the skin and subcutaneous tissue, unspecified: Secondary | ICD-10-CM

## 2021-07-10 DIAGNOSIS — Z1283 Encounter for screening for malignant neoplasm of skin: Secondary | ICD-10-CM | POA: Diagnosis not present

## 2021-07-10 DIAGNOSIS — Z86018 Personal history of other benign neoplasm: Secondary | ICD-10-CM | POA: Diagnosis not present

## 2021-07-10 DIAGNOSIS — D239 Other benign neoplasm of skin, unspecified: Secondary | ICD-10-CM

## 2021-07-10 DIAGNOSIS — D1801 Hemangioma of skin and subcutaneous tissue: Secondary | ICD-10-CM | POA: Diagnosis not present

## 2021-07-21 ENCOUNTER — Encounter: Payer: Self-pay | Admitting: Dermatology

## 2021-07-21 NOTE — Progress Notes (Signed)
? ?  Follow-Up Visit ?  ?Subjective  ?Autumn Bowman is a 71 y.o. female who presents for the following: Annual Exam (Pt here for annual exam. No concerns. ). ? ?General skin examination, history of atypical nevus back and thigh ?Location:  ?Duration:  ?Quality:  ?Associated Signs/Symptoms: ?Modifying Factors:  ?Severity:  ?Timing: ?Context:  ? ?Objective  ?Well appearing patient in no apparent distress; mood and affect are within normal limits. ?General skin examination: No atypical pigmented lesions (all checked with dermoscopy), no nonmelanoma skin cancer. ? ?Left Leg, Right Leg ?Several 1 to 3 mm smooth red dermal papules ? ?Neck - Anterior ?Noninflamed white 6 mm upper dermal papule ? ?Left Thigh - Posterior ?Scar is clear ? ? ? ?A full examination was performed including scalp, head, eyes, ears, nose, lips, neck, chest, axillae, abdomen, back, buttocks, bilateral upper extremities, bilateral lower extremities, hands, feet, fingers, toes, fingernails, and toenails. All findings within normal limits unless otherwise noted below.  Areas beneath undergarments not fully examined. ? ? ?Assessment & Plan  ? ? ?Encounter for screening for malignant neoplasm of skin ? ?Annual skin examination. ? ?Cherry angioma (2) ?Left Leg; Right Leg ? ?No intervention necessary ? ?Cyst of skin ?Neck - Anterior ? ?Discussed elective excision, no surgery scheduled ? ?History of dysplastic nevus ?Left Thigh - Posterior ? ?Check as needed change ? ? ? ? ? ?I, Lavonna Monarch, MD, have reviewed all documentation for this visit.  The documentation on 07/21/21 for the exam, diagnosis, procedures, and orders are all accurate and complete. ?

## 2022-02-05 ENCOUNTER — Ambulatory Visit: Payer: Medicare Other | Admitting: Dermatology

## 2022-05-20 DIAGNOSIS — M8589 Other specified disorders of bone density and structure, multiple sites: Secondary | ICD-10-CM | POA: Diagnosis not present

## 2022-05-20 DIAGNOSIS — M810 Age-related osteoporosis without current pathological fracture: Secondary | ICD-10-CM | POA: Diagnosis not present

## 2022-07-14 ENCOUNTER — Ambulatory Visit: Payer: Medicare Other | Admitting: Dermatology

## 2022-08-01 DIAGNOSIS — L03031 Cellulitis of right toe: Secondary | ICD-10-CM | POA: Diagnosis not present

## 2022-08-01 DIAGNOSIS — B351 Tinea unguium: Secondary | ICD-10-CM | POA: Diagnosis not present

## 2022-08-20 DIAGNOSIS — J019 Acute sinusitis, unspecified: Secondary | ICD-10-CM | POA: Diagnosis not present

## 2022-08-20 DIAGNOSIS — J209 Acute bronchitis, unspecified: Secondary | ICD-10-CM | POA: Diagnosis not present

## 2022-09-23 DIAGNOSIS — J209 Acute bronchitis, unspecified: Secondary | ICD-10-CM | POA: Diagnosis not present

## 2022-09-23 DIAGNOSIS — R059 Cough, unspecified: Secondary | ICD-10-CM | POA: Diagnosis not present

## 2022-09-23 DIAGNOSIS — J309 Allergic rhinitis, unspecified: Secondary | ICD-10-CM | POA: Diagnosis not present

## 2022-10-07 DIAGNOSIS — M79605 Pain in left leg: Secondary | ICD-10-CM | POA: Diagnosis not present

## 2022-10-07 DIAGNOSIS — I8390 Asymptomatic varicose veins of unspecified lower extremity: Secondary | ICD-10-CM | POA: Diagnosis not present

## 2022-10-08 DIAGNOSIS — I82812 Embolism and thrombosis of superficial veins of left lower extremities: Secondary | ICD-10-CM | POA: Diagnosis not present

## 2022-10-08 DIAGNOSIS — I82442 Acute embolism and thrombosis of left tibial vein: Secondary | ICD-10-CM | POA: Diagnosis not present

## 2022-10-08 DIAGNOSIS — I82409 Acute embolism and thrombosis of unspecified deep veins of unspecified lower extremity: Secondary | ICD-10-CM | POA: Diagnosis not present

## 2022-10-08 DIAGNOSIS — I824Z2 Acute embolism and thrombosis of unspecified deep veins of left distal lower extremity: Secondary | ICD-10-CM | POA: Diagnosis not present

## 2022-10-08 DIAGNOSIS — I83812 Varicose veins of left lower extremities with pain: Secondary | ICD-10-CM | POA: Diagnosis not present

## 2022-11-03 DIAGNOSIS — M79676 Pain in unspecified toe(s): Secondary | ICD-10-CM | POA: Diagnosis not present

## 2022-11-03 DIAGNOSIS — B351 Tinea unguium: Secondary | ICD-10-CM | POA: Diagnosis not present

## 2022-11-06 DIAGNOSIS — B351 Tinea unguium: Secondary | ICD-10-CM | POA: Diagnosis not present

## 2022-11-06 DIAGNOSIS — E7849 Other hyperlipidemia: Secondary | ICD-10-CM | POA: Diagnosis not present

## 2022-11-06 DIAGNOSIS — M25562 Pain in left knee: Secondary | ICD-10-CM | POA: Diagnosis not present

## 2022-11-06 DIAGNOSIS — H6121 Impacted cerumen, right ear: Secondary | ICD-10-CM | POA: Diagnosis not present

## 2022-11-06 DIAGNOSIS — E782 Mixed hyperlipidemia: Secondary | ICD-10-CM | POA: Diagnosis not present

## 2022-11-06 DIAGNOSIS — R030 Elevated blood-pressure reading, without diagnosis of hypertension: Secondary | ICD-10-CM | POA: Diagnosis not present

## 2022-11-06 DIAGNOSIS — Z0000 Encounter for general adult medical examination without abnormal findings: Secondary | ICD-10-CM | POA: Diagnosis not present

## 2022-11-06 DIAGNOSIS — Z1321 Encounter for screening for nutritional disorder: Secondary | ICD-10-CM | POA: Diagnosis not present

## 2022-11-06 DIAGNOSIS — I82409 Acute embolism and thrombosis of unspecified deep veins of unspecified lower extremity: Secondary | ICD-10-CM | POA: Diagnosis not present

## 2022-11-26 DIAGNOSIS — H0102B Squamous blepharitis left eye, upper and lower eyelids: Secondary | ICD-10-CM | POA: Diagnosis not present

## 2022-11-26 DIAGNOSIS — H524 Presbyopia: Secondary | ICD-10-CM | POA: Diagnosis not present

## 2022-11-26 DIAGNOSIS — H0102A Squamous blepharitis right eye, upper and lower eyelids: Secondary | ICD-10-CM | POA: Diagnosis not present

## 2022-11-26 DIAGNOSIS — H2513 Age-related nuclear cataract, bilateral: Secondary | ICD-10-CM | POA: Diagnosis not present

## 2022-12-16 DIAGNOSIS — H90A22 Sensorineural hearing loss, unilateral, left ear, with restricted hearing on the contralateral side: Secondary | ICD-10-CM | POA: Diagnosis not present

## 2022-12-16 DIAGNOSIS — H9042 Sensorineural hearing loss, unilateral, left ear, with unrestricted hearing on the contralateral side: Secondary | ICD-10-CM | POA: Diagnosis not present

## 2022-12-16 DIAGNOSIS — H6121 Impacted cerumen, right ear: Secondary | ICD-10-CM | POA: Diagnosis not present

## 2023-02-03 DIAGNOSIS — H1130 Conjunctival hemorrhage, unspecified eye: Secondary | ICD-10-CM | POA: Diagnosis not present

## 2023-02-04 DIAGNOSIS — M79676 Pain in unspecified toe(s): Secondary | ICD-10-CM | POA: Diagnosis not present

## 2023-02-04 DIAGNOSIS — H1132 Conjunctival hemorrhage, left eye: Secondary | ICD-10-CM | POA: Diagnosis not present

## 2023-02-04 DIAGNOSIS — H2513 Age-related nuclear cataract, bilateral: Secondary | ICD-10-CM | POA: Diagnosis not present

## 2023-02-04 DIAGNOSIS — B351 Tinea unguium: Secondary | ICD-10-CM | POA: Diagnosis not present

## 2023-02-25 DIAGNOSIS — Z23 Encounter for immunization: Secondary | ICD-10-CM | POA: Diagnosis not present

## 2023-05-20 DIAGNOSIS — M79676 Pain in unspecified toe(s): Secondary | ICD-10-CM | POA: Diagnosis not present

## 2023-05-20 DIAGNOSIS — B351 Tinea unguium: Secondary | ICD-10-CM | POA: Diagnosis not present

## 2023-07-29 DIAGNOSIS — M79676 Pain in unspecified toe(s): Secondary | ICD-10-CM | POA: Diagnosis not present

## 2023-07-29 DIAGNOSIS — B351 Tinea unguium: Secondary | ICD-10-CM | POA: Diagnosis not present

## 2023-11-10 DIAGNOSIS — E785 Hyperlipidemia, unspecified: Secondary | ICD-10-CM | POA: Diagnosis not present

## 2023-11-10 DIAGNOSIS — Z0000 Encounter for general adult medical examination without abnormal findings: Secondary | ICD-10-CM | POA: Diagnosis not present

## 2023-11-10 DIAGNOSIS — I8390 Asymptomatic varicose veins of unspecified lower extremity: Secondary | ICD-10-CM | POA: Diagnosis not present

## 2023-11-11 DIAGNOSIS — M79674 Pain in right toe(s): Secondary | ICD-10-CM | POA: Diagnosis not present

## 2023-11-11 DIAGNOSIS — B351 Tinea unguium: Secondary | ICD-10-CM | POA: Diagnosis not present

## 2023-11-11 DIAGNOSIS — M79675 Pain in left toe(s): Secondary | ICD-10-CM | POA: Diagnosis not present

## 2023-11-17 DIAGNOSIS — Z1231 Encounter for screening mammogram for malignant neoplasm of breast: Secondary | ICD-10-CM | POA: Diagnosis not present

## 2023-12-09 DIAGNOSIS — H0102B Squamous blepharitis left eye, upper and lower eyelids: Secondary | ICD-10-CM | POA: Diagnosis not present

## 2023-12-09 DIAGNOSIS — H0102A Squamous blepharitis right eye, upper and lower eyelids: Secondary | ICD-10-CM | POA: Diagnosis not present

## 2023-12-09 DIAGNOSIS — H2513 Age-related nuclear cataract, bilateral: Secondary | ICD-10-CM | POA: Diagnosis not present

## 2023-12-09 DIAGNOSIS — H40013 Open angle with borderline findings, low risk, bilateral: Secondary | ICD-10-CM | POA: Diagnosis not present

## 2024-01-22 DIAGNOSIS — M79675 Pain in left toe(s): Secondary | ICD-10-CM | POA: Diagnosis not present

## 2024-01-22 DIAGNOSIS — B351 Tinea unguium: Secondary | ICD-10-CM | POA: Diagnosis not present

## 2024-01-22 DIAGNOSIS — M79674 Pain in right toe(s): Secondary | ICD-10-CM | POA: Diagnosis not present
